# Patient Record
Sex: Male | Born: 1950 | Race: White | Hispanic: No
Health system: Southern US, Community
[De-identification: ages and names within clinical notes are randomized; demographics above are authoritative.]

## PROBLEM LIST (undated history)

## (undated) DIAGNOSIS — I10 Essential (primary) hypertension: Secondary | ICD-10-CM

## (undated) DIAGNOSIS — K589 Irritable bowel syndrome without diarrhea: Secondary | ICD-10-CM

## (undated) DIAGNOSIS — F419 Anxiety disorder, unspecified: Secondary | ICD-10-CM

## (undated) DIAGNOSIS — K219 Gastro-esophageal reflux disease without esophagitis: Secondary | ICD-10-CM

## (undated) DIAGNOSIS — F32A Depression, unspecified: Secondary | ICD-10-CM

## (undated) DIAGNOSIS — E079 Disorder of thyroid, unspecified: Secondary | ICD-10-CM

## (undated) HISTORY — PX: INNER EAR SURGERY: SHX679

## (undated) HISTORY — PX: ANTERIOR CRUCIATE LIGAMENT REPAIR: SHX115

---

## 2009-10-22 ENCOUNTER — Encounter: Admission: RE | Admit: 2009-10-22 | Discharge: 2009-10-22 | Payer: Self-pay | Admitting: Orthopedic Surgery

## 2009-12-15 ENCOUNTER — Ambulatory Visit (HOSPITAL_COMMUNITY): Admission: RE | Admit: 2009-12-15 | Discharge: 2009-12-15 | Payer: Self-pay | Admitting: Urology

## 2014-05-02 ENCOUNTER — Other Ambulatory Visit: Payer: Self-pay | Admitting: Otolaryngology

## 2014-05-02 DIAGNOSIS — R42 Dizziness and giddiness: Secondary | ICD-10-CM

## 2014-05-15 ENCOUNTER — Other Ambulatory Visit: Payer: Self-pay

## 2014-05-16 ENCOUNTER — Ambulatory Visit
Admission: RE | Admit: 2014-05-16 | Discharge: 2014-05-16 | Disposition: A | Discharge status: Home / Self Care | Payer: Federal, State, Local not specified - PPO | Source: Ambulatory Visit | Attending: Otolaryngology | Admitting: Otolaryngology

## 2014-05-16 DIAGNOSIS — R42 Dizziness and giddiness: Secondary | ICD-10-CM

## 2014-05-16 MED ORDER — GADOBENATE DIMEGLUMINE 529 MG/ML IV SOLN
20.0000 mL | Freq: Once | INTRAVENOUS | Status: AC | PRN
  Administered 2014-05-16: 20 mL via INTRAVENOUS

## 2014-09-12 ENCOUNTER — Encounter: Payer: Self-pay | Admitting: Gastroenterology

## 2014-10-30 ENCOUNTER — Ambulatory Visit: Payer: Federal, State, Local not specified - PPO | Admitting: Gastroenterology

## 2017-04-13 ENCOUNTER — Telehealth: Payer: Self-pay

## 2020-09-07 ENCOUNTER — Emergency Department (HOSPITAL_BASED_OUTPATIENT_CLINIC_OR_DEPARTMENT_OTHER): Payer: Medicare Other

## 2020-09-07 ENCOUNTER — Other Ambulatory Visit: Payer: Self-pay

## 2020-09-07 ENCOUNTER — Encounter (HOSPITAL_BASED_OUTPATIENT_CLINIC_OR_DEPARTMENT_OTHER): Payer: Self-pay

## 2020-09-07 ENCOUNTER — Emergency Department (HOSPITAL_BASED_OUTPATIENT_CLINIC_OR_DEPARTMENT_OTHER)
Admission: EM | Admit: 2020-09-07 | Discharge: 2020-09-07 | Disposition: A | Discharge status: Home / Self Care | Payer: Medicare Other | Attending: Emergency Medicine | Admitting: Emergency Medicine

## 2020-09-07 DIAGNOSIS — S161XXA Strain of muscle, fascia and tendon at neck level, initial encounter: Secondary | ICD-10-CM | POA: Diagnosis not present

## 2020-09-07 DIAGNOSIS — M79661 Pain in right lower leg: Secondary | ICD-10-CM | POA: Diagnosis not present

## 2020-09-07 DIAGNOSIS — S0990XA Unspecified injury of head, initial encounter: Secondary | ICD-10-CM | POA: Diagnosis not present

## 2020-09-07 DIAGNOSIS — W109XXA Fall (on) (from) unspecified stairs and steps, initial encounter: Secondary | ICD-10-CM | POA: Insufficient documentation

## 2020-09-07 DIAGNOSIS — I10 Essential (primary) hypertension: Secondary | ICD-10-CM | POA: Insufficient documentation

## 2020-09-07 DIAGNOSIS — M79662 Pain in left lower leg: Secondary | ICD-10-CM | POA: Diagnosis not present

## 2020-09-07 DIAGNOSIS — Y9301 Activity, walking, marching and hiking: Secondary | ICD-10-CM | POA: Insufficient documentation

## 2020-09-07 DIAGNOSIS — Z87891 Personal history of nicotine dependence: Secondary | ICD-10-CM | POA: Insufficient documentation

## 2020-09-07 DIAGNOSIS — W19XXXA Unspecified fall, initial encounter: Secondary | ICD-10-CM

## 2020-09-07 HISTORY — DX: Anxiety disorder, unspecified: F41.9

## 2020-09-07 HISTORY — DX: Disorder of thyroid, unspecified: E07.9

## 2020-09-07 HISTORY — DX: Gastro-esophageal reflux disease without esophagitis: K21.9

## 2020-09-07 HISTORY — DX: Irritable bowel syndrome, unspecified: K58.9

## 2020-09-07 HISTORY — DX: Essential (primary) hypertension: I10

## 2020-09-07 HISTORY — DX: Depression, unspecified: F32.A

## 2020-09-07 NOTE — ED Provider Notes (Signed)
[facility] EMERGENCY DEPARTMENT Provider Note   CSN: [id number] Arrival date & time: 09/07/20  1004     History Chief Complaint  Patient presents with  . Fall    Chase Ross is a 70 y.o. male.  Patient presents today for evaluation of pain related to a fall that occurred yesterday. Patient states that he was walking down stairs when he had immediate sharp severe pain in his right calf. This caused him to fall down several stairs. Subsequently he also had pain in his left calf. He applied an Ace wrap. No medications. He states when he fell that he hit his head on a wall and when he woke this morning he had pain in the back of his head as well as his neck. No weakness, numbness, or tingling in the arms or the legs. Pain was severe yesterday and he could not walk on his legs, however can ambulate today. He states that his wife looked at the back of his legs and saw a swollen blood vessel, which in part prompted his visit today. No history of anticoagulation. No distal numbness or tingling. He denies loss of consciousness.  No h/o DVT.   Patient reports having sinusitis symptoms for which she was seen at the [place] on 08/27/2021. He states that he had a Covid test at that time which came back as positive. He states that sinus symptoms have been improving but not resolved. No current cough or fever.        Past Medical History:  Diagnosis Date  . Anxiety   . Depression   . GERD (gastroesophageal reflux disease)   . Hypertension   . IBS (irritable bowel syndrome)   . Thyroid disease     There are no problems to display for this patient.   Past Surgical History:  Procedure Laterality Date  . ANTERIOR CRUCIATE LIGAMENT REPAIR    . INNER EAR SURGERY         No family history on file.  Social History   Tobacco Use  . Smoking status: Former Games developer  . Smokeless tobacco: Never Used  Substance Use Topics  . Alcohol use: Yes    Comment: social  . Drug use: Never     Home Medications Prior to Admission medications   Not on File    Allergies    Sulfa antibiotics  Review of Systems   Review of Systems  Constitutional: Negative for fatigue.  HENT: Negative for tinnitus.   Eyes: Negative for photophobia, pain and visual disturbance.  Respiratory: Negative for shortness of breath.   Cardiovascular: Negative for chest pain.  Gastrointestinal: Negative for nausea and vomiting.  Musculoskeletal: Positive for gait problem, myalgias and neck pain. Negative for arthralgias and back pain.  Skin: Negative for wound.  Neurological: Positive for headaches. Negative for dizziness, weakness, light-headedness and numbness.  Psychiatric/Behavioral: Negative for confusion and decreased concentration.    Physical Exam Updated Vital Signs BP 123/83 (BP Location: Left Arm)   Pulse 64   Temp 98.9 F (37.2 C) (Oral)   Resp 16   Ht 5\' 10"  (1.778 m)   Wt 117 kg   SpO2 97%   BMI 37.02 kg/m   Physical Exam Vitals and nursing note reviewed.  Constitutional:      Appearance: He is well-developed and well-nourished.  HENT:     Head: Normocephalic and atraumatic.  Eyes:     General:        Right eye: No discharge.  Left eye: No discharge.     Conjunctiva/sclera: Conjunctivae normal.  Cardiovascular:     Rate and Rhythm: Normal rate and regular rhythm.     Heart sounds: Normal heart sounds.  Pulmonary:     Effort: Pulmonary effort is normal.     Breath sounds: Normal breath sounds.  Abdominal:     Palpations: Abdomen is soft.     Tenderness: There is no abdominal tenderness.  Musculoskeletal:     Cervical back: Normal range of motion and neck supple. Tenderness present. No bony tenderness. Normal range of motion.     Thoracic back: Tenderness (upper most t-spine paraspinous) present. No bony tenderness. Normal range of motion.     Lumbar back: No tenderness or bony tenderness.     Right knee: Normal range of motion. No tenderness.     Left  knee: Normal range of motion. No tenderness.     Right lower leg: Tenderness present. No edema.     Left lower leg: Tenderness present. No edema.     Right ankle: No tenderness. Normal range of motion.     Left ankle: No tenderness. Normal range of motion.     Right foot: Normal range of motion.     Left foot: Normal range of motion.     Comments: Right calf: There is tenderness noted. Patient with a small varicosity on the posterior calf. No significant edema.  Left calf: There is tenderness noted. There is spasm of the distal calf and tenderness in this area. No significant edema.  Skin:    General: Skin is warm and dry.  Neurological:     Mental Status: He is alert.  Psychiatric:        Mood and Affect: Mood and affect normal.     ED Results / Procedures / Treatments   Labs (all labs ordered are listed, but only abnormal results are displayed) Labs Reviewed - No data to display  EKG None  Radiology No results found.  Procedures Procedures (including critical care time)  Medications Ordered in ED Medications - No data to display  ED Course  I have reviewed the triage vital signs and the nursing notes.  Pertinent labs & imaging results that were available during my care of the patient were reviewed by me and considered in my medical decision making (see chart for details).  Patient seen and examined. X-ray and CT ordered. Given reported history, it sounds like the patient pulled a muscle in his calf or had an acute spasm which caused him to fall. He has palpable spasm the current time. History would be very atypical for DVT, given acute onset of bilateral calf pain. Negative Thompson test bilaterally. Pain in the head and the neck seems to be more musculoskeletal in nature, however given age will obtain imaging. Patient did not have immediate neck or head pain, however this developed this morning.  Vital signs reviewed and are as follows: BP 123/83 (BP Location: Left Arm)    Pulse 64   Temp 98.9 F (37.2 C) (Oral)   Resp 16   Ht 5\' 10"  (1.778 m)   Wt 117 kg   SpO2 97%   BMI 37.02 kg/m   12:38 PM patient updated on results.  Discussed that no broken bones noted on x-rays.  This does not exclude muscle, tendon, or ligamentous injury.  Encouraged continued treatment with rice protocol, OTC meds.  Encouraged follow-up with his orthopedist for evaluation especially if symptoms are not resolving as expected.  Patient verbalizes understanding agrees with plan.  Will give Ace wrap for other lower extremity.  Patient has replaced the Ace wrap on the right leg.  Regarding head and neck, imaging is negative.  Continue symptomatic treatment as above.     MDM Rules/Calculators/A&P                          Bilateral calf pain: Patient had acute right sided calf pain which caused him to fall and then had left sided calf pain as well.  I suspect that this is musculoskeletal in nature.  It would be extremely unusual to develop acute bilateral DVT without preceding symptoms and also unusual for DVT to cause pain such as the patient describes.  Symptoms are improving.  There is no gross edema.  X-ray imaging negative.  Suspect muscle spasm or potentially a calf tear.  Head and neck pain: Started this morning several hours after the injury.  Imaging of the head and cervical spine is negative.  Do not suspect significant traumatic injury.  Suspect musculoskeletal pain as well.   Final Clinical Impression(s) / ED Diagnoses Final diagnoses:  Pain of left calf  Pain of right calf  Strain of neck muscle, initial encounter  Minor head injury, initial encounter  Fall, initial encounter    Rx / DC Orders ED Discharge Orders    None       Renne Crigler, PA-C 09/07/20 1241    Alvira Monday, MD 09/09/20 1506

## 2020-09-07 NOTE — ED Triage Notes (Signed)
Pt reports falling yesterday r/t pain in both lower legs. Pt states that when he fell he hit his head, no blood thinners, no LOC, c/o pain now in shoulders neck and head with continued pain in both legs.

## 2020-09-07 NOTE — ED Notes (Signed)
Ace wrap applied to LT calf.

## 2020-09-07 NOTE — Discharge Instructions (Signed)
Please read and follow all provided instructions.  Your diagnoses today include:  1. Pain of left calf   2. Pain of right calf   3. Strain of neck muscle, initial encounter   4. Minor head injury, initial encounter   5. Fall, initial encounter     Tests performed today include:  An x-ray of the affected areas - do NOT show any broken bones  CT of the head and neck -does not show any broken bones or other severe injuries to the head or upper spine  Vital signs. See below for your results today.   Medications prescribed:   None  Take any prescribed medications only as directed.  Home care instructions:   Follow any educational materials contained in this packet  Use over-the-counter medications as directed on packaging for pain  You may also use heat on the areas that are sore or spasmed  Follow R.I.C.E. Protocol:  R - rest your injury   I  - use ice on injury without applying directly to skin  C - compress injury with bandage or splint  E - elevate the injury as much as possible  Follow-up instructions: Please follow-up with your orthopedic physician (bone specialist) if you continue to have significant pain.   Return instructions:   Please return if your toes or feet are numb or tingling, appear gray or blue, or you have severe pain (also elevate the leg and loosen splint or wrap if you were given one)  Please return to the Emergency Department if you experience worsening symptoms.   Please return if you have any other emergent concerns.  Additional Information:  Your vital signs today were: BP 123/83 (BP Location: Left Arm)   Pulse 64   Temp 98.9 F (37.2 C) (Oral)   Resp 16   Ht 5\' 10"  (1.778 m)   Wt 117 kg   SpO2 97%   BMI 37.02 kg/m  If your blood pressure (BP) was elevated above 135/85 this visit, please have this repeated by your doctor within one month. --------------

## 2020-09-17 ENCOUNTER — Encounter (HOSPITAL_BASED_OUTPATIENT_CLINIC_OR_DEPARTMENT_OTHER): Payer: Self-pay

## 2022-08-12 IMAGING — CT CT HEAD W/O CM
3 series · 15 of 47 positions shown, 18 images · non-contrast
Comparison: 10/22/2009

CLINICAL DATA: Fell yesterday with trauma to the head and neck

EXAM:
CT HEAD WITHOUT CONTRAST
CT CERVICAL SPINE WITHOUT CONTRAST
TECHNIQUE: Multidetector CT imaging of the head and cervical spine was
performed following the standard protocol without intravenous
contrast. Multiplanar CT image reconstructions of the cervical spine
were also generated.

[Series 2: head 5.0 h30s · axial · 0.45mm/px · z∈[-464,-328]mm · 9 of 33 slices shown, 12 images]
[im 3/33  brain]
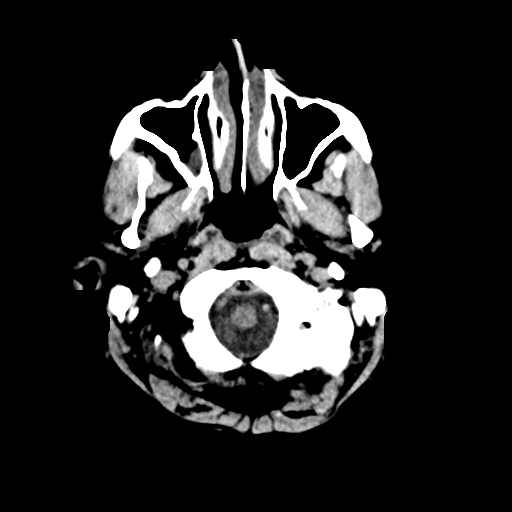
[im 3/33  bone]
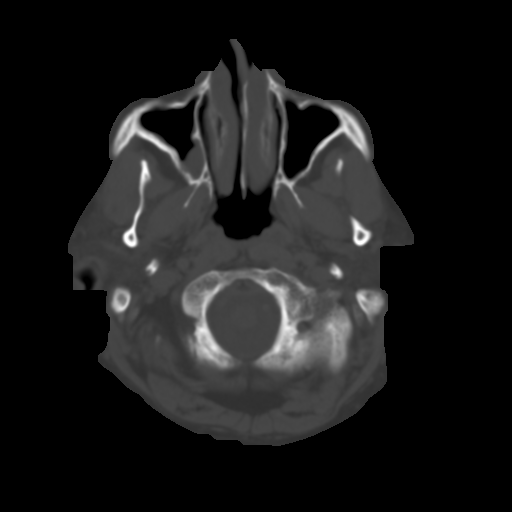
[im 6/33  brain]
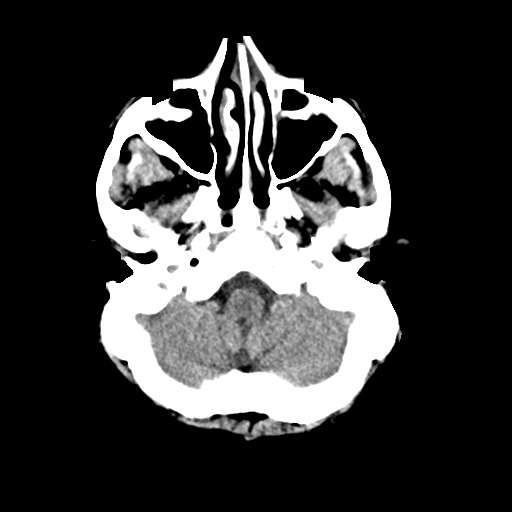
[im 9/33  brain]
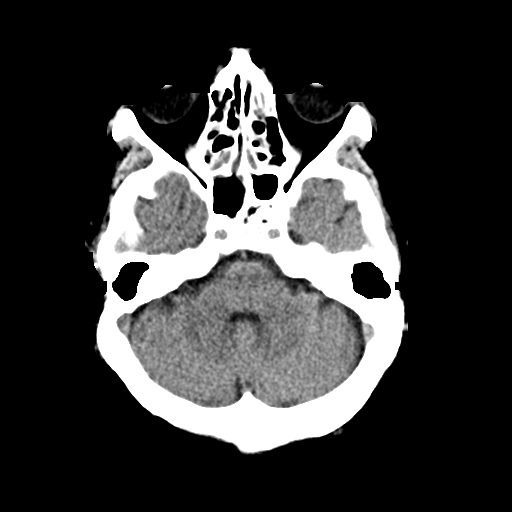
[im 13/33  brain]
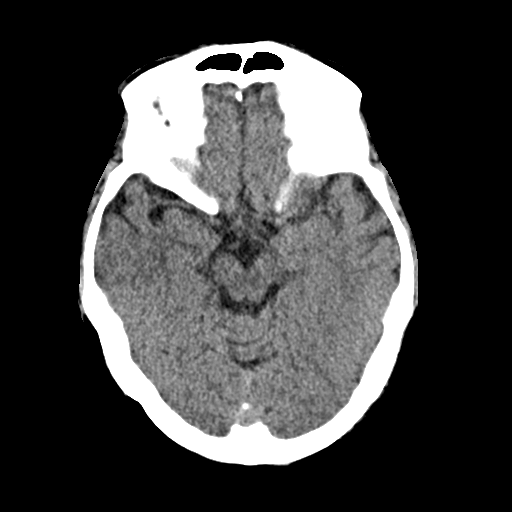
[im 17/33  brain]
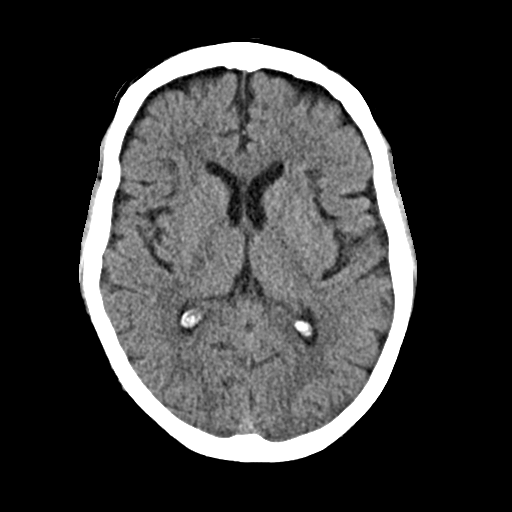
[im 17/33  bone]
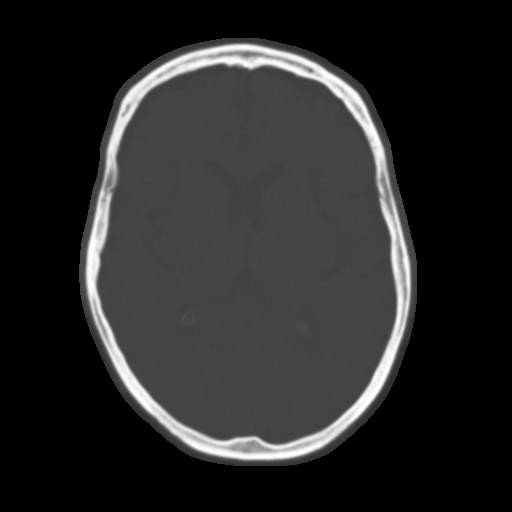
[im 20/33  brain]
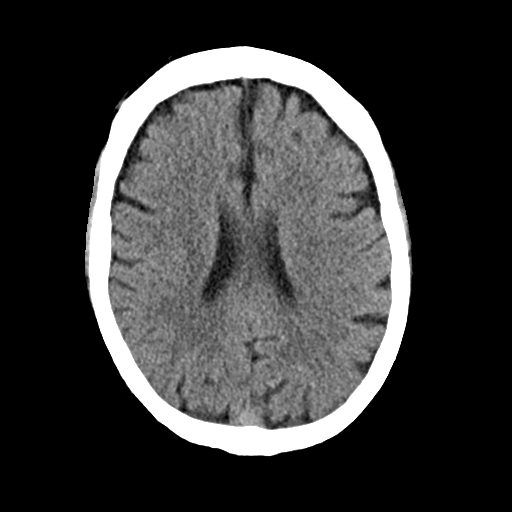
[im 24/33  brain]
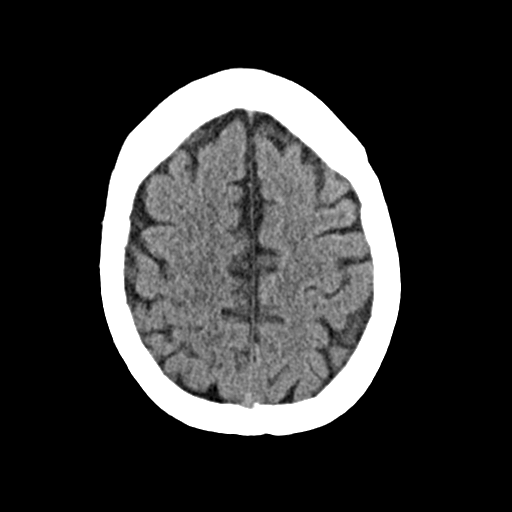
[im 27/33  brain]
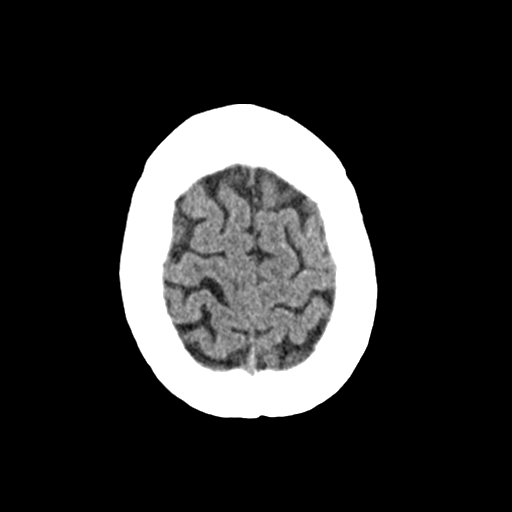
[im 30/33  brain]
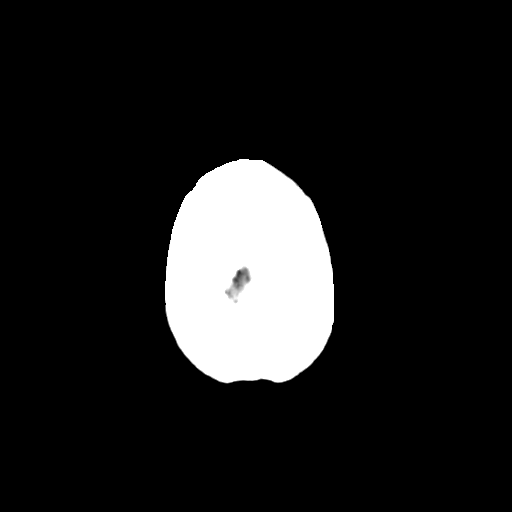
[im 30/33  bone]
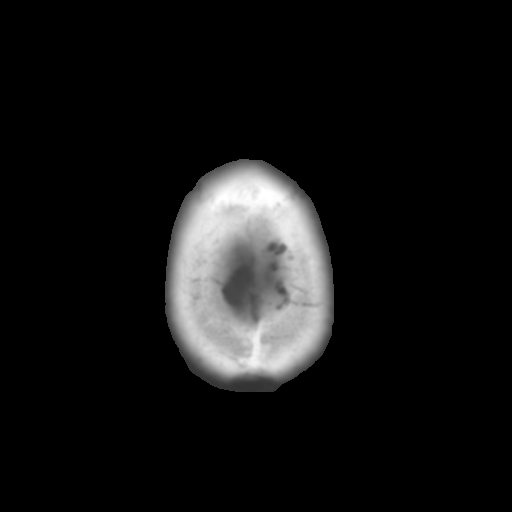

[Series 4: head 3.0 mpr cor · coronal · 0.34mm/px · 3 of 72 slices shown]
[im 24/72  brain]
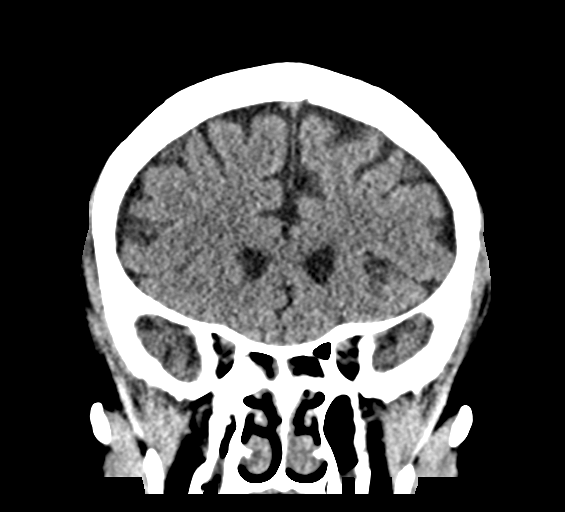
[im 32/72  brain]
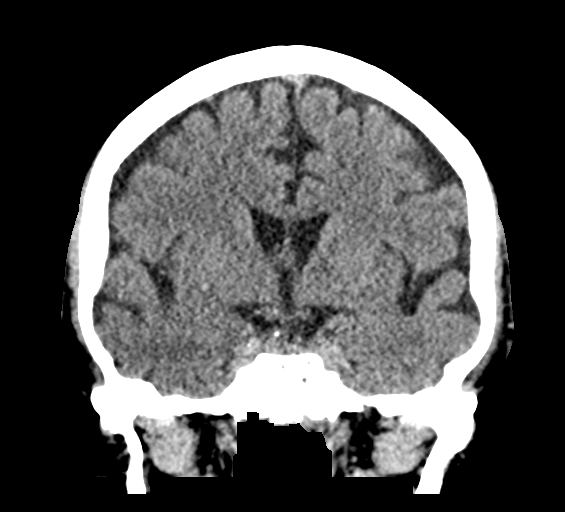
[im 40/72  brain]
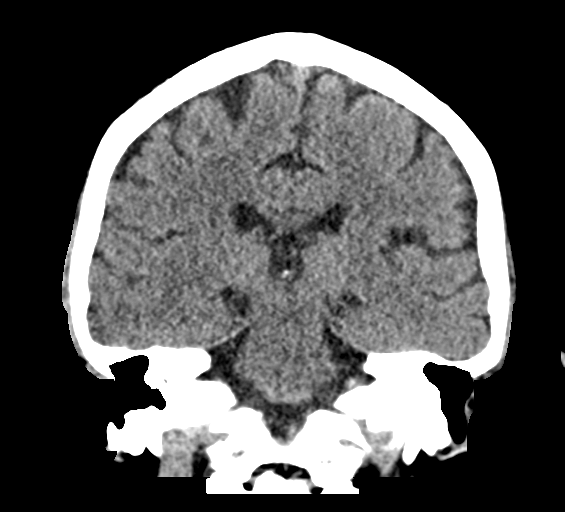

[Series 5: head 3.0 mpr sag · sagittal · 0.32mm/px · 3 of 64 slices shown]
[im 22/64  brain]
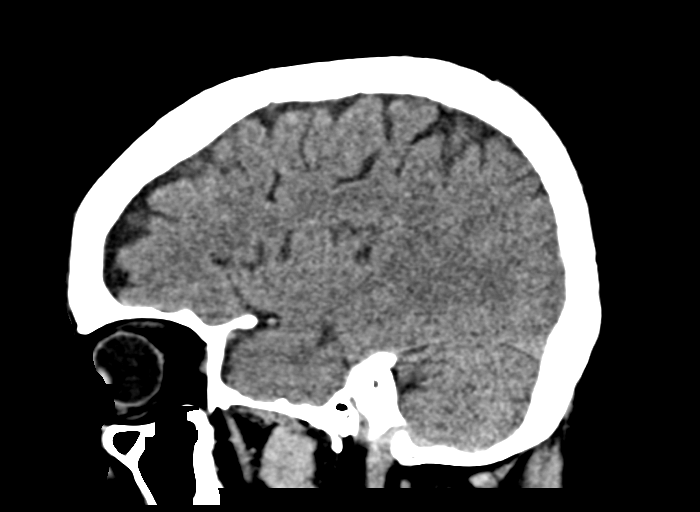
[im 32/64  brain]
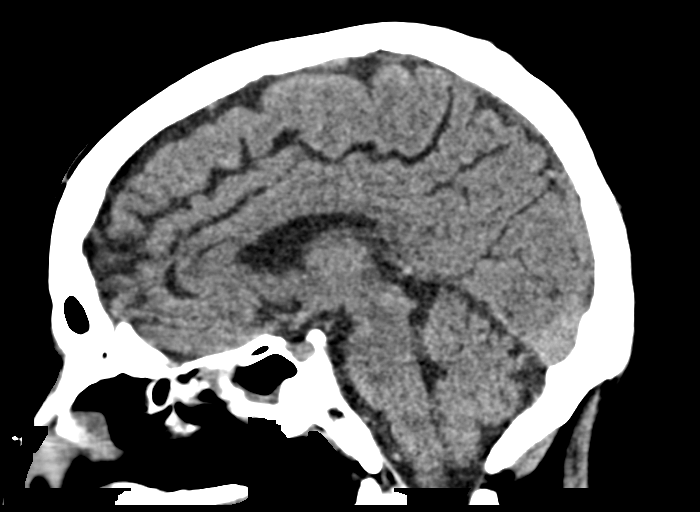
[im 43/64  brain]
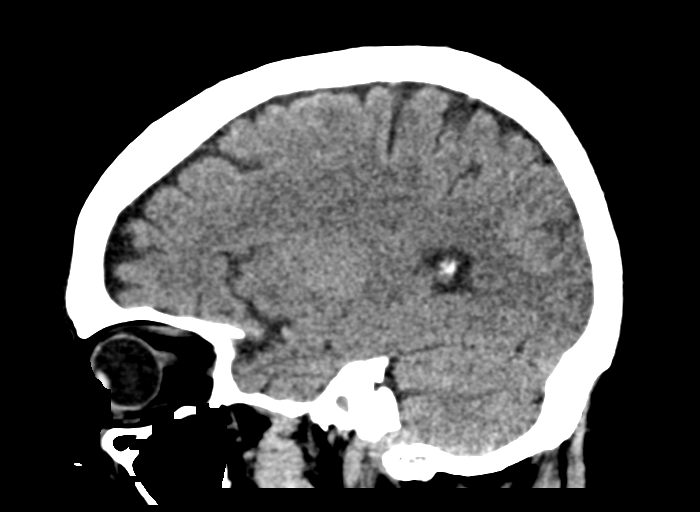

[15 of 47 positions shown; findings below may reference images not displayed]

FINDINGS: CT HEAD FINDINGS

Brain: Mild age related volume loss. No old or acute focal
infarction, mass lesion, hemorrhage, hydrocephalus or extra-axial
collection.

Vascular: No abnormal vascular finding.

Skull: No skull fracture.

Sinuses/Orbits: Seasonal mucosal inflammatory changes, particularly
affecting the ethmoid sinuses. Orbits negative.

Other: None

CT CERVICAL SPINE FINDINGS

Alignment: Normal

Skull base and vertebrae: No fracture or primary bone lesion.

Soft tissues and spinal canal: No significant soft tissue finding.

Disc levels: Ordinary degenerative spondylosis C3-4 through C6-7.
Facet osteoarthritis of a mild degree. Foraminal narrowing that
could potentially cause neural compression or irritation on the left
at C5-6. Left C7-T1 facet arthritis could be a cause local pain.

Upper chest: Negative

Other: None
IMPRESSION: HEAD CT:

No acute or traumatic finding. Mild age related volume loss. Sinus
mucosal inflammatory change.

CERVICAL SPINE CT:

No acute or traumatic finding. Ordinary degenerative spondylosis and
facet osteoarthritis. Foraminal narrowing on the left at C5-6.

## 2022-08-12 IMAGING — DX DG TIBIA/FIBULA 2V*R*
4 series · 4 of 4 positions shown · non-contrast
Comparison: 09/07/2020, 09/06/2014

CLINICAL DATA: Fall downstairs, calf pain

EXAM:
RIGHT TIBIA AND FIBULA - 2 VIEW

[tibia ap (1 of 2)]
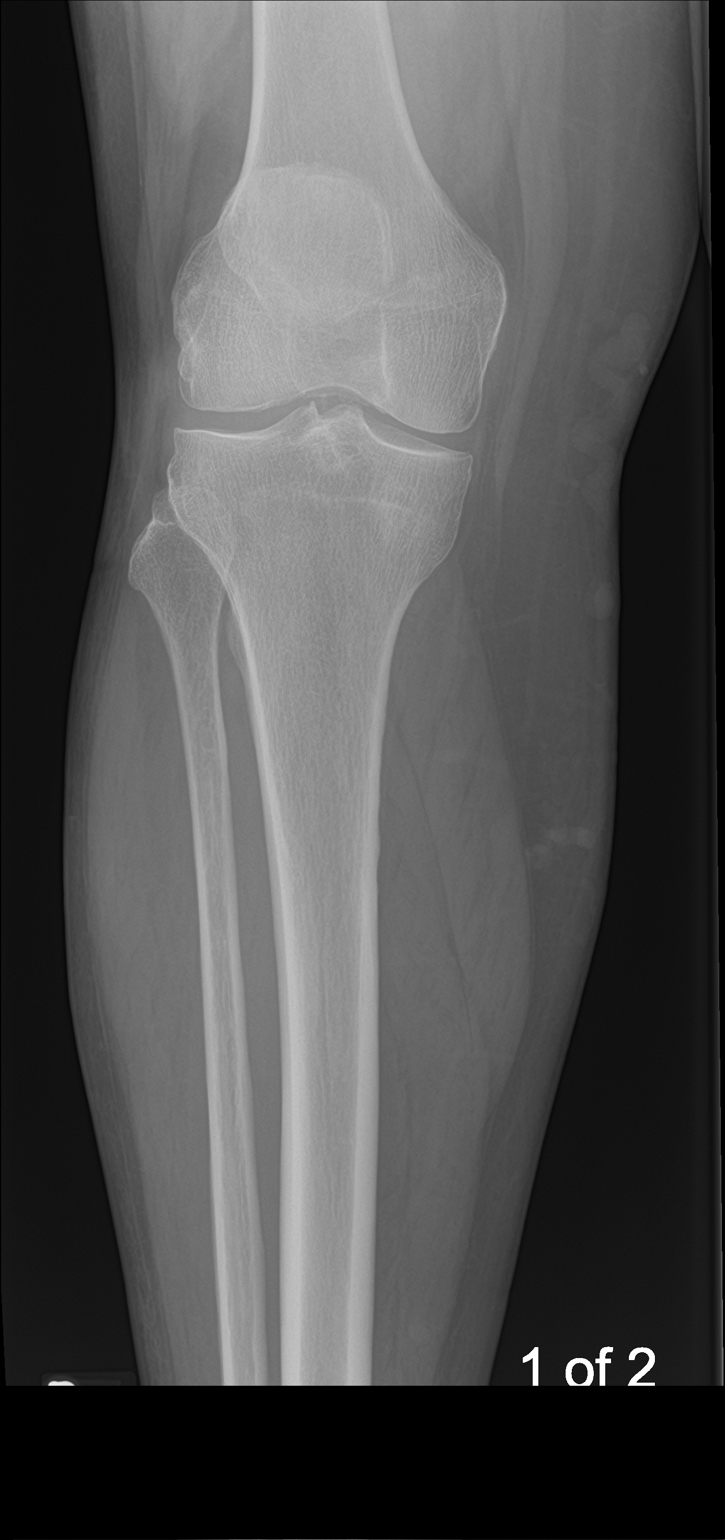

[tibia ap (2 of 2)]
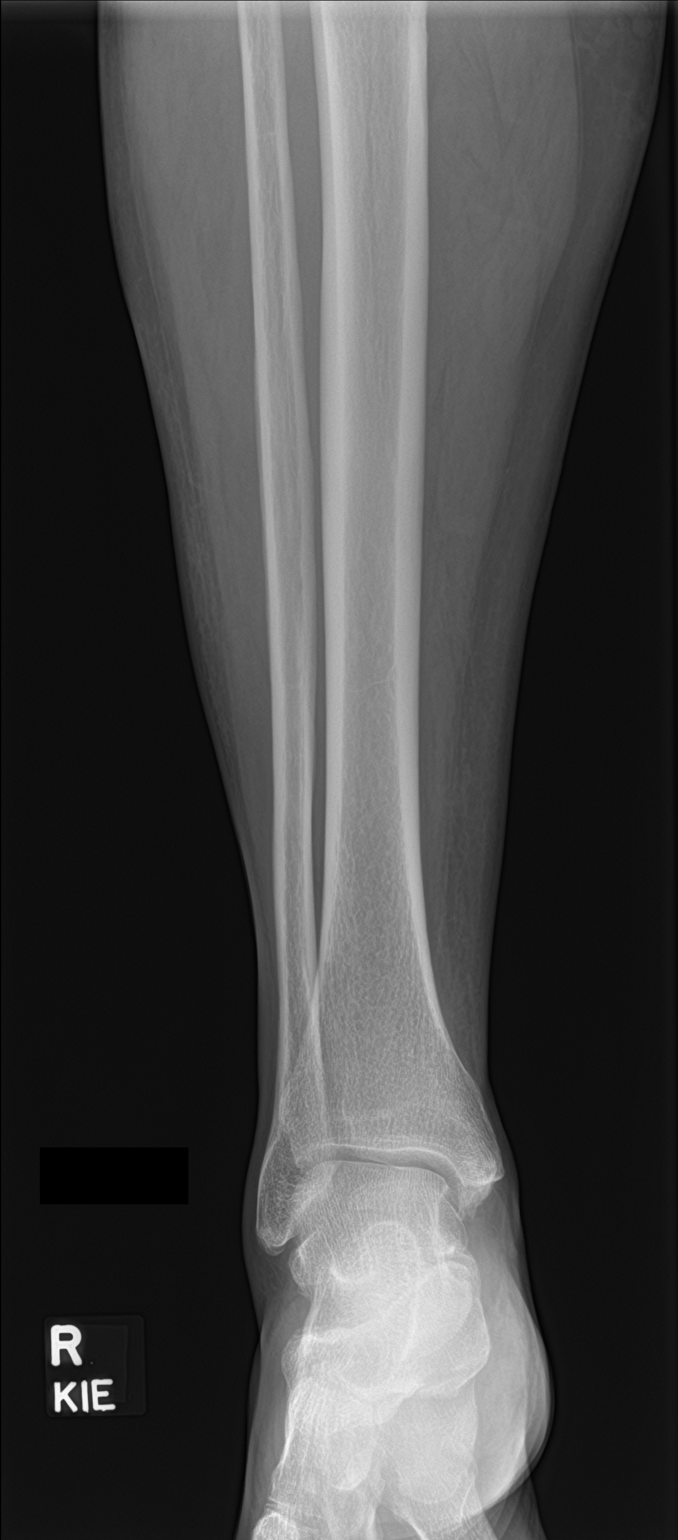

[tibia lat (1 of 2)]
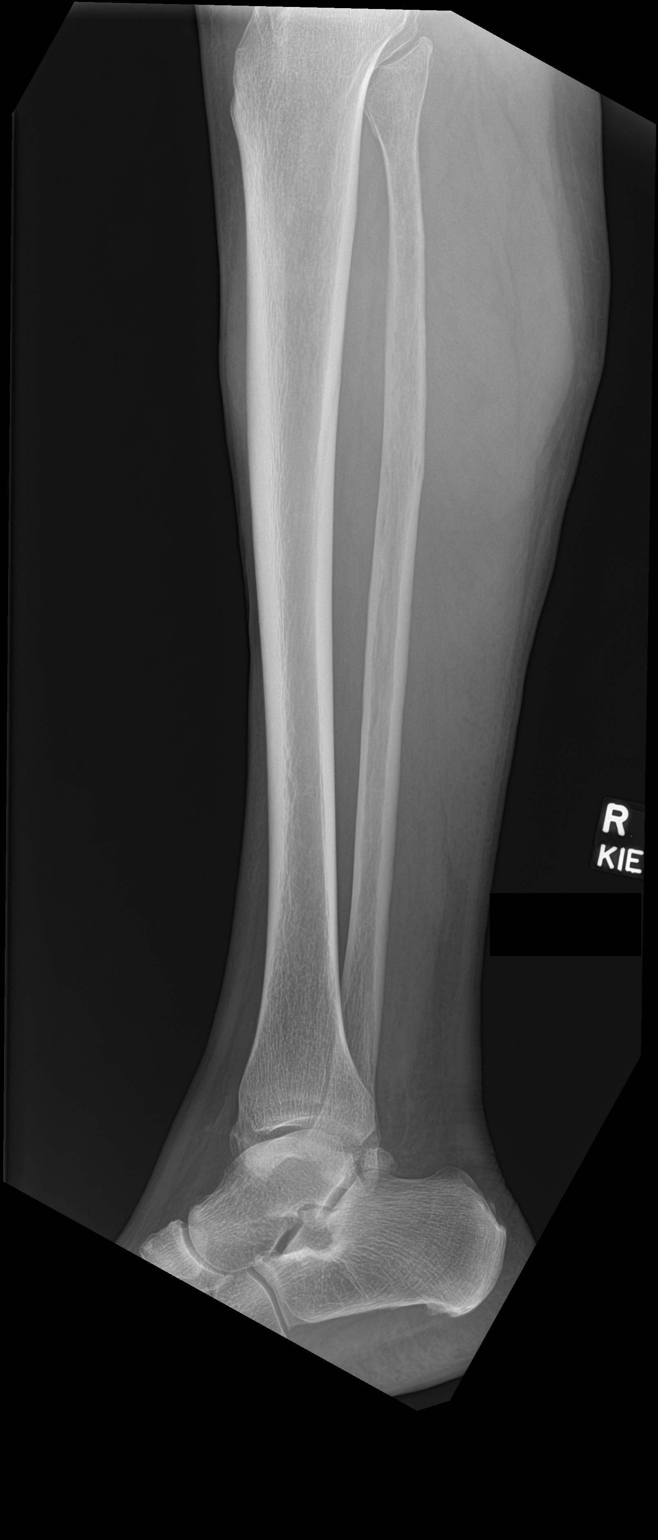

[tibia lat (2 of 2)]
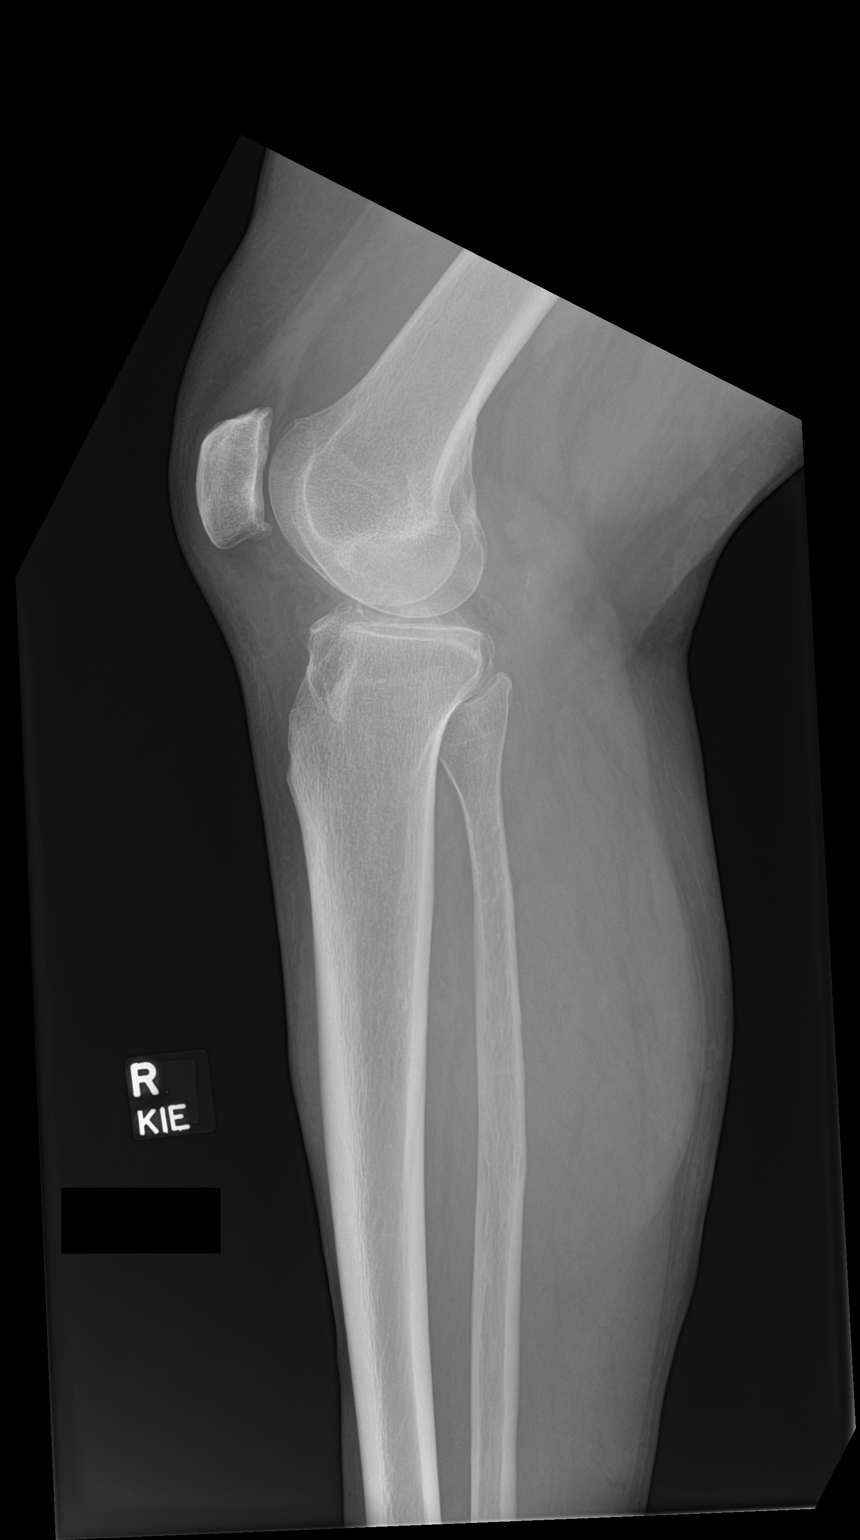

[4 of 4 positions shown; findings below may reference images not displayed]

FINDINGS: Normal alignment. Negative for fracture. Intact tibia and fibula.
Right medial knee and calf sub surface varicosities noted.
IMPRESSION: No acute finding.
